# Patient Record
Sex: Male | Born: 1975 | Race: White | Hispanic: No | Marital: Single
Health system: Southern US, Community
[De-identification: ages and names within clinical notes are randomized; demographics above are authoritative.]

---

## 2007-09-26 ENCOUNTER — Emergency Department: Payer: Self-pay | Admitting: Emergency Medicine

## 2010-01-13 ENCOUNTER — Emergency Department: Payer: Self-pay | Admitting: Emergency Medicine

## 2017-01-27 ENCOUNTER — Emergency Department
Admission: EM | Admit: 2017-01-27 | Discharge: 2017-01-27 | Disposition: A | Discharge status: Home / Self Care | Payer: Self-pay | Attending: Emergency Medicine | Admitting: Emergency Medicine

## 2017-01-27 ENCOUNTER — Encounter: Payer: Self-pay | Admitting: Medical Oncology

## 2017-01-27 DIAGNOSIS — A5139 Other secondary syphilis of skin: Secondary | ICD-10-CM | POA: Insufficient documentation

## 2017-01-27 LAB — COMPREHENSIVE METABOLIC PANEL
ALBUMIN: 4.5 g/dL (ref 3.5–5.0)
ALK PHOS: 87 U/L (ref 38–126)
ALT: 19 U/L (ref 17–63)
AST: 23 U/L (ref 15–41)
Anion gap: 8 (ref 5–15)
BILIRUBIN TOTAL: 1.3 mg/dL — AB (ref 0.3–1.2)
BUN: 11 mg/dL (ref 6–20)
CALCIUM: 9.6 mg/dL (ref 8.9–10.3)
CO2: 29 mmol/L (ref 22–32)
Chloride: 101 mmol/L (ref 101–111)
Creatinine, Ser: 0.8 mg/dL (ref 0.61–1.24)
GFR calc Af Amer: >60 mL/min (ref >60)
GFR calc non Af Amer: >60 mL/min (ref >60)
GLUCOSE: 101 mg/dL — AB (ref 65–99)
Potassium: 4.7 mmol/L (ref 3.5–5.1)
SODIUM: 138 mmol/L (ref 135–145)
TOTAL PROTEIN: 7.8 g/dL (ref 6.5–8.1)

## 2017-01-27 LAB — CBC WITH DIFFERENTIAL/PLATELET
Basophils Absolute: 0 10*3/uL (ref 0–0.1)
Basophils Relative: 0 %
EOS ABS: 0 10*3/uL (ref 0–0.7)
EOS PCT: 1 %
HEMATOCRIT: 47.4 % (ref 40.0–52.0)
HEMOGLOBIN: 16.7 g/dL (ref 13.0–18.0)
LYMPHS ABS: 1.3 10*3/uL (ref 1.0–3.6)
LYMPHS PCT: 24 %
MCH: 31.1 pg (ref 26.0–34.0)
MCHC: 35.2 g/dL (ref 32.0–36.0)
MCV: 88.5 fL (ref 80.0–100.0)
MONO ABS: 0.6 10*3/uL (ref 0.2–1.0)
Monocytes Relative: 11 %
Neutro Abs: 3.5 10*3/uL (ref 1.4–6.5)
Neutrophils Relative %: 64 %
Platelets: 219 10*3/uL (ref 150–440)
RBC: 5.36 MIL/uL (ref 4.40–5.90)
RDW: 14.6 % — ABNORMAL HIGH (ref 11.5–14.5)
WBC: 5.5 10*3/uL (ref 3.8–10.6)

## 2017-01-27 MED ORDER — TRAMADOL HCL 50 MG PO TABS: 50.0000 mg | ORAL_TABLET | Freq: Four times a day (QID) | ORAL | 0 refills | Status: AC | PRN

## 2017-01-27 MED ORDER — IBUPROFEN 600 MG PO TABS: 600.0000 mg | ORAL_TABLET | Freq: Three times a day (TID) | ORAL | 0 refills | Status: DC | PRN

## 2017-01-27 MED ORDER — PENICILLIN G BENZATHINE 1200000 UNIT/2ML IM SUSP
2.4000 10*6.[IU] | Freq: Once | INTRAMUSCULAR | Status: AC
  Administered 2017-01-27: 2.4 10*6.[IU] via INTRAMUSCULAR
  Filled 2017-01-27: qty 4

## 2017-01-27 NOTE — ED Provider Notes (Signed)
[facility] Emergency Department Provider Note   ____________________________________________   First MD Initiated Contact with Patient 01/27/17 1215     (approximate)  I have reviewed the triage vital signs and the nursing notes.   HISTORY  Chief Complaint Rash    HPI Jeremy Bowman. is a 41 y.o. male patient presented for rash on the palmar aspect of bilateral hand and plantar aspect bilateral foot. No oral lesions.  Patient state rash broke out 3 days ago. Patient state he initially stated was a contact dermatitis as he works outside a lot but  no relief using calamine lotion.Patient state is not consistent active 5 months. Patient denies any other STDs signs and symptoms.   History reviewed. No pertinent past medical history.  There are no active problems to display for this patient.   No past surgical history on file.  Prior to Admission medications   Medication Sig Start Date End Date Taking? Authorizing Provider  ibuprofen (ADVIL,MOTRIN) 600 MG tablet Take 1 tablet (600 mg total) by mouth every 8 (eight) hours as needed. 01/27/17   Joni Reining, PA-C  traMADol (ULTRAM) 50 MG tablet Take 1 tablet (50 mg total) by mouth every 6 (six) hours as needed. 01/27/17 01/27/18  Joni Reining, PA-C    Allergies Patient has no known allergies.  No family history on file.  Social History Social History  Substance Use Topics  . Smoking status: Not on file  . Smokeless tobacco: Not on file  . Alcohol use Not on file    Review of Systems  Constitutional: No fever/chills Eyes: No visual changes. ENT: No sore throat. Cardiovascular: Denies chest pain. Respiratory: Denies shortness of breath. Gastrointestinal: No abdominal pain.  No nausea, no vomiting.  No diarrhea.  No constipation. Genitourinary: Negative for dysuria. Musculoskeletal: Negative for back pain. Skin: Positive for rash palmar aspect of bilateral hand and plantar aspect  of bilateral foot. Neurological: Negative for headaches, focal weakness or numbness.   ____________________________________________   PHYSICAL EXAM:  VITAL SIGNS: ED Triage Vitals [01/27/17 1141]  Enc Vitals Group     BP (!) 142/100     Pulse Rate 78     Resp 16     Temp 97.7 F (36.5 C)     Temp Source Oral     SpO2 100 %     Weight 180 lb (81.6 kg)     Height  (1.854 m)     Head Circumference      Peak Flow      Pain Score 7     Pain Loc      Pain Edu?      Excl. in GC?     Constitutional: Alert and oriented. Well appearing and in no acute distress. Eyes: Conjunctivae are normal. PERRL. EOMI. Head: Atraumatic. Nose: No congestion/rhinnorhea. Mouth/Throat: Mucous membranes are moist.  Oropharynx non-erythematous. Neck: No stridor.   Hematological/Lymphatic/Immunilogical: No cervical lymphadenopathy. Cardiovascular: Normal rate, regular rhythm. Grossly normal heart sounds.  Good peripheral circulation. Respiratory: Normal respiratory effort.  No retractions. Lungs CTAB. Gastrointestinal: Soft and nontender. No distention. No abdominal bruits. No CVA tenderness. Musculoskeletal: No lower extremity tenderness nor edema.  No joint effusions. Neurologic:  Normal speech and language. No gross focal neurologic deficits are appreciated. No gait instability. Skin:  Skin is warm, dry and intact. Rashes noted above. Psychiatric: Mood and affect are normal. Speech and behavior are normal.  ____________________________________________   LABS (all labs ordered are listed,  but only abnormal results are displayed)  Labs Reviewed  CBC WITH DIFFERENTIAL/PLATELET - Abnormal; Notable for the following:       Result Value   RDW 14.6 (*)    All other components within normal limits  COMPREHENSIVE METABOLIC PANEL - Abnormal; Notable for the following:    Glucose, Bld 101 (*)    Total Bilirubin 1.3 (*)    All other components within normal limits  RPR    ____________________________________________  EKG   ____________________________________________  RADIOLOGY  No results found.  ____________________________________________   PROCEDURES  Procedure(s) performed: None  Procedures  Critical Care performed: No  ____________________________________________   INITIAL IMPRESSION / ASSESSMENT AND PLAN / ED COURSE  Pertinent labs & imaging results that were available during my care of the patient were reviewed by me and considered in my medical decision making (see chart for details).  Macular rash on the palmar hand is and plan aspect of foot consistent with syphilis. Discussed with patient the rationale was given penicillin G IM. Patient advised labs pending he will contact was test is positive. Advised patient to follow-up with [facility].      ____________________________________________   FINAL CLINICAL IMPRESSION(S) / ED DIAGNOSES  Final diagnoses:  Rash of secondary syphilis      NEW MEDICATIONS STARTED DURING THIS VISIT:  New Prescriptions   IBUPROFEN (ADVIL,MOTRIN) 600 MG TABLET    Take 1 tablet (600 mg total) by mouth every 8 (eight) hours as needed.   TRAMADOL (ULTRAM) 50 MG TABLET    Take 1 tablet (50 mg total) by mouth every 6 (six) hours as needed.     Note:  This document was prepared using Dragon voice recognition software and may include unintentional dictation errors.    Joni Reining, PA-C 01/27/17 1338    Jeanmarie Plant, MD 01/27/17 1425

## 2017-01-27 NOTE — Discharge Instructions (Signed)
History of rashes consistent with secondary syphilis. Lab results are pending. Advised to follow-up with [facility]. This department will call you if you lab results come back positive.

## 2017-01-28 LAB — RPR: RPR Ser Ql: NONREACTIVE

## 2017-05-12 ENCOUNTER — Emergency Department
Admission: EM | Admit: 2017-05-12 | Discharge: 2017-05-12 | Disposition: A | Discharge status: Home / Self Care | Payer: Self-pay | Attending: Emergency Medicine | Admitting: Emergency Medicine

## 2017-05-12 ENCOUNTER — Encounter: Payer: Self-pay | Admitting: Emergency Medicine

## 2017-05-12 ENCOUNTER — Other Ambulatory Visit: Payer: Self-pay

## 2017-05-12 DIAGNOSIS — L03012 Cellulitis of left finger: Secondary | ICD-10-CM | POA: Insufficient documentation

## 2017-05-12 MED ORDER — SULFAMETHOXAZOLE-TRIMETHOPRIM 800-160 MG PO TABS: 1.0000 | ORAL_TABLET | Freq: Two times a day (BID) | ORAL | 0 refills | Status: DC

## 2017-05-12 MED ORDER — NAPROXEN 500 MG PO TABS: 500.0000 mg | ORAL_TABLET | Freq: Two times a day (BID) | ORAL | 0 refills | Status: DC

## 2017-05-12 MED ORDER — LIDOCAINE HCL (PF) 1 % IJ SOLN
5.0000 mL | Freq: Once | INTRAMUSCULAR | Status: AC
  Administered 2017-05-12: 5 mL

## 2017-05-12 MED ORDER — OXYCODONE-ACETAMINOPHEN 5-325 MG PO TABS
1.0000 | ORAL_TABLET | Freq: Once | ORAL | Status: AC
  Administered 2017-05-12: 1 via ORAL
  Filled 2017-05-12: qty 1

## 2017-05-12 MED ORDER — LIDOCAINE HCL (PF) 1 % IJ SOLN
INTRAMUSCULAR | Status: AC
  Administered 2017-05-12: 5 mL
  Filled 2017-05-12: qty 5

## 2017-05-12 MED ORDER — SULFAMETHOXAZOLE-TRIMETHOPRIM 800-160 MG PO TABS
1.0000 | ORAL_TABLET | Freq: Once | ORAL | Status: AC
  Administered 2017-05-12: 1 via ORAL
  Filled 2017-05-12: qty 1

## 2017-05-12 MED ORDER — OXYCODONE-ACETAMINOPHEN 7.5-325 MG PO TABS: 1.0000 | ORAL_TABLET | Freq: Four times a day (QID) | ORAL | 0 refills | Status: DC | PRN

## 2017-05-12 NOTE — ED Provider Notes (Signed)
[facility] Emergency Department Provider Note   ____________________________________________   None    (approximate)  I have reviewed the triage vital signs and the nursing notes.   HISTORY  Chief Complaint Finger infection    HPI Jeremy FarmerBobby E Mitzel Jr. is a 42 y.o. male patient presents with edema, erythema, and pain to left ring finger secondary to ingrown nail. Patient state he pulls his cuticle out 2 days ago and yesterday became very read painful. Patient state he put area for pain and had moderate relief with mild expression of purulent material. Patient say awakened this morning with increased pain and edema. Patient denies loss of sensation or loss of function of the affected digit.   History reviewed. No pertinent past medical history.  There are no active problems to display for this patient.     Prior to Admission medications   Medication Sig Start Date End Date Taking? Authorizing Provider  ibuprofen (ADVIL,MOTRIN) 600 MG tablet Take 1 tablet (600 mg total) by mouth every 8 (eight) hours as needed. 01/27/17   Joni ReiningSmith, Cadience Bradfield K, PA-C  naproxen (NAPROSYN) 500 MG tablet Take 1 tablet (500 mg total) by mouth 2 (two) times daily with a meal. 05/12/17   Joni ReiningSmith, Nimrit Kehres K, PA-C  oxyCODONE-acetaminophen (PERCOCET) 7.5-325 MG tablet Take 1 tablet by mouth every 6 (six) hours as needed for severe pain. 05/12/17   Joni ReiningSmith, Marcianne Ozbun K, PA-C  sulfamethoxazole-trimethoprim (BACTRIM DS,SEPTRA DS) 800-160 MG tablet Take 1 tablet by mouth 2 (two) times daily. 05/12/17   Joni ReiningSmith, Isiaah Cuervo K, PA-C  traMADol (ULTRAM) 50 MG tablet Take 1 tablet (50 mg total) by mouth every 6 (six) hours as needed. 01/27/17 01/27/18  Joni ReiningSmith, Kyleena Scheirer K, PA-C    Allergies Patient has no known allergies.  No family history on file.  Social History Social History   Tobacco Use  . Smoking status: Not on file  Substance Use Topics  . Alcohol use: Not on file  . Drug use: Not on file     Review of Systems Constitutional: No fever/chills Eyes: No visual changes. ENT: No sore throat. Cardiovascular: Denies chest pain. Respiratory: Denies shortness of breath. Gastrointestinal: No abdominal pain.  No nausea, no vomiting.  No diarrhea.  No constipation. Genitourinary: Negative for dysuria. Musculoskeletal: Negative for back pain. Skin: Edema and erythema third digit left hand. Neurological: Negative for headaches, focal weakness or numbness.  ____________________________________________   PHYSICAL EXAM:  VITAL SIGNS: ED Triage Vitals [05/12/17 1521]  Enc Vitals Group     BP (!) 155/106     Pulse Rate 81     Resp 18     Temp 98 F (36.7 C)     Temp Source Oral     SpO2 100 %     Weight 186 lb (84.4 kg)     Height 6\' 1"  (1.854 m)     Head Circumference      Peak Flow      Pain Score 6     Pain Loc      Pain Edu?      Excl. in GC?    Constitutional: Alert and oriented. Well appearing and in no acute distress. Cardiovascular: Normal rate, regular rhythm. Grossly normal heart sounds.  Good peripheral circulation. Elevated blood pressure Respiratory: Normal respiratory effort.  No retractions. Lungs CTAB. Gastrointestinal: Soft and nontender. No distention. No abdominal bruits. No CVA tenderness. Musculoskeletal: No lower extremity tenderness nor edema.  No joint effusions. Neurologic:  Normal speech and language.  No gross focal neurologic deficits are appreciated. No gait instability. Skin: Edema and erythema third digit nailbed of the left hand.  Psychiatric: Mood and affect are normal. Speech and behavior are normal.  ____________________________________________   LABS (all labs ordered are listed, but only abnormal results are displayed)  Labs Reviewed - No data to display ____________________________________________  EKG   ____________________________________________  RADIOLOGY  No results  found.  ____________________________________________   PROCEDURES  Procedure(s) performed: None  .Marland KitchenIncision and Drainage Date/Time: 05/12/2017 4:18 PM Performed by: Joni Reining, PA-C Authorized by: Joni Reining, PA-C   Consent:    Consent obtained:  Verbal   Consent given by:  Patient   Risks discussed:  Bleeding, incomplete drainage and infection Location:    Type:  Abscess   Location:  Upper extremity   Upper extremity location:  Finger   Finger location:  L long finger Pre-procedure details:    Skin preparation:  Betadine Anesthesia (see MAR for exact dosages):    Anesthesia method:  Nerve block   Block needle gauge:  25 G   Block anesthetic:  Lidocaine 1% w/o epi   Block technique:  Digital block   Block outcome:  Anesthesia achieved Procedure type:    Complexity:  Simple Procedure details:    Incision types:  Stab incision   Scalpel blade:  11   Drainage:  Purulent   Drainage amount:  Moderate   Wound treatment:  Wound left open   Packing materials:  None Post-procedure details:    Patient tolerance of procedure:  Tolerated well, no immediate complications    Critical Care performed: No  ____________________________________________   INITIAL IMPRESSION / ASSESSMENT AND PLAN / ED COURSE  As part of my medical decision making, I reviewed the following data within the electronic MEDICAL RECORD NUMBER    Paronychia third digit left hand. Patient expressed moderate relief status post incision and drainage. Patient given discharge care instructions. Patient given a prescription for Bactrim DS and Percocets. Patient advised to follow up as needed.      ____________________________________________   FINAL CLINICAL IMPRESSION(S) / ED DIAGNOSES  Final diagnoses:  Paronychia of finger, left     ED Discharge Orders        Ordered    sulfamethoxazole-trimethoprim (BACTRIM DS,SEPTRA DS) 800-160 MG tablet  2 times daily     05/12/17 1608     oxyCODONE-acetaminophen (PERCOCET) 7.5-325 MG tablet  Every 6 hours PRN     05/12/17 1608    naproxen (NAPROSYN) 500 MG tablet  2 times daily with meals     05/12/17 1608       Note:  This document was prepared using Dragon voice recognition software and may include unintentional dictation errors.    Joni Reining, PA-C 05/12/17 1620    Joni Reining, PA-C 05/12/17 1621    Schaevitz, Myra Rude, MD 05/12/17 [phone]

## 2017-05-12 NOTE — ED Notes (Addendum)
See triage note  presents with swelling and pain to left ring finger  States he had an ingrown nail..pulled it out  The tip of finger began to swell  States he punctured it with a needle  Now area is more swollen and painful

## 2017-05-12 NOTE — ED Triage Notes (Signed)
Pt reports thinks he had ingrown nail to left ring finger with then became infected. Pt reports pricked it with a pin yesterday and the pain was relieved but the swelling and pain have returned today.

## 2019-07-16 ENCOUNTER — Other Ambulatory Visit: Payer: Self-pay

## 2019-07-16 ENCOUNTER — Emergency Department
Admission: EM | Admit: 2019-07-16 | Discharge: 2019-07-16 | Disposition: A | Discharge status: Home / Self Care | Payer: Self-pay | Attending: Emergency Medicine | Admitting: Emergency Medicine

## 2019-07-16 ENCOUNTER — Emergency Department: Payer: Self-pay

## 2019-07-16 DIAGNOSIS — S43122A Dislocation of left acromioclavicular joint, 100%-200% displacement, initial encounter: Secondary | ICD-10-CM | POA: Insufficient documentation

## 2019-07-16 DIAGNOSIS — Y929 Unspecified place or not applicable: Secondary | ICD-10-CM | POA: Insufficient documentation

## 2019-07-16 DIAGNOSIS — Y9355 Activity, bike riding: Secondary | ICD-10-CM | POA: Insufficient documentation

## 2019-07-16 DIAGNOSIS — Y999 Unspecified external cause status: Secondary | ICD-10-CM | POA: Insufficient documentation

## 2019-07-16 DIAGNOSIS — S43102A Unspecified dislocation of left acromioclavicular joint, initial encounter: Secondary | ICD-10-CM

## 2019-07-16 MED ORDER — MELOXICAM 15 MG PO TABS: 15.0000 mg | ORAL_TABLET | Freq: Every day | ORAL | 2 refills | Status: AC
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
## 2019-07-16 NOTE — Discharge Instructions (Addendum)
Follow-up with [facility] clinic orthopedics.  Please call for appointment. Apply ice to the left shoulder. Wear the sling for 2 to 3 weeks. After 2 to 3 weeks may start range of motion exercises. Return to the emergency department if worsening
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
